# Patient Record
Sex: Female | Born: 1940 | Race: Black or African American | Hispanic: No | Marital: Married | Smoking: Never smoker
Health system: Southern US, Community
[De-identification: ages and names within clinical notes are randomized; demographics above are authoritative.]

## PROBLEM LIST (undated history)

## (undated) DIAGNOSIS — I10 Essential (primary) hypertension: Secondary | ICD-10-CM

## (undated) DIAGNOSIS — M1990 Unspecified osteoarthritis, unspecified site: Secondary | ICD-10-CM

## (undated) DIAGNOSIS — E119 Type 2 diabetes mellitus without complications: Secondary | ICD-10-CM

## (undated) DIAGNOSIS — R252 Cramp and spasm: Secondary | ICD-10-CM

## (undated) DIAGNOSIS — K219 Gastro-esophageal reflux disease without esophagitis: Secondary | ICD-10-CM

## (undated) DIAGNOSIS — G629 Polyneuropathy, unspecified: Secondary | ICD-10-CM

## (undated) DIAGNOSIS — M810 Age-related osteoporosis without current pathological fracture: Secondary | ICD-10-CM

## (undated) DIAGNOSIS — N289 Disorder of kidney and ureter, unspecified: Secondary | ICD-10-CM

## (undated) DIAGNOSIS — E7800 Pure hypercholesterolemia, unspecified: Secondary | ICD-10-CM

## (undated) HISTORY — DX: Disorder of kidney and ureter, unspecified: N28.9

## (undated) HISTORY — DX: Polyneuropathy, unspecified: G62.9

## (undated) HISTORY — DX: Cramp and spasm: R25.2

## (undated) HISTORY — DX: Unspecified osteoarthritis, unspecified site: M19.90

## (undated) HISTORY — DX: Pure hypercholesterolemia, unspecified: E78.00

## (undated) HISTORY — DX: Essential (primary) hypertension: I10

## (undated) HISTORY — PX: OTHER SURGICAL HISTORY: SHX169

## (undated) HISTORY — DX: Age-related osteoporosis without current pathological fracture: M81.0

## (undated) HISTORY — DX: Type 2 diabetes mellitus without complications: E11.9

## (undated) HISTORY — DX: Gastro-esophageal reflux disease without esophagitis: K21.9

---

## 2004-06-05 ENCOUNTER — Ambulatory Visit: Payer: Self-pay | Admitting: Internal Medicine

## 2005-04-10 ENCOUNTER — Ambulatory Visit: Payer: Self-pay | Admitting: Gastroenterology

## 2005-06-07 ENCOUNTER — Ambulatory Visit: Payer: Self-pay | Admitting: Internal Medicine

## 2005-09-12 ENCOUNTER — Ambulatory Visit: Payer: Self-pay | Admitting: Internal Medicine

## 2006-02-08 ENCOUNTER — Ambulatory Visit: Payer: Self-pay | Admitting: Internal Medicine

## 2006-04-30 ENCOUNTER — Ambulatory Visit: Payer: Self-pay | Admitting: Internal Medicine

## 2006-06-12 ENCOUNTER — Ambulatory Visit: Payer: Self-pay | Admitting: Internal Medicine

## 2007-10-08 ENCOUNTER — Ambulatory Visit: Payer: Self-pay | Admitting: Internal Medicine

## 2007-11-28 ENCOUNTER — Ambulatory Visit: Payer: Self-pay | Admitting: Internal Medicine

## 2008-10-19 ENCOUNTER — Ambulatory Visit: Payer: Self-pay | Admitting: Internal Medicine

## 2008-11-03 ENCOUNTER — Ambulatory Visit: Payer: Self-pay | Admitting: Nephrology

## 2008-11-19 ENCOUNTER — Ambulatory Visit: Payer: Self-pay | Admitting: Unknown Physician Specialty

## 2009-01-10 ENCOUNTER — Ambulatory Visit: Payer: Self-pay | Admitting: Internal Medicine

## 2009-01-10 ENCOUNTER — Encounter: Payer: Self-pay | Admitting: Gastroenterology

## 2009-01-25 ENCOUNTER — Encounter: Payer: Self-pay | Admitting: Gastroenterology

## 2009-01-25 ENCOUNTER — Ambulatory Visit: Payer: Self-pay | Admitting: Internal Medicine

## 2009-02-15 ENCOUNTER — Encounter: Payer: Self-pay | Admitting: Gastroenterology

## 2009-03-09 ENCOUNTER — Encounter: Payer: Self-pay | Admitting: Gastroenterology

## 2009-03-09 ENCOUNTER — Telehealth (INDEPENDENT_AMBULATORY_CARE_PROVIDER_SITE_OTHER): Payer: Self-pay | Admitting: *Deleted

## 2009-03-09 DIAGNOSIS — R933 Abnormal findings on diagnostic imaging of other parts of digestive tract: Secondary | ICD-10-CM

## 2009-04-07 ENCOUNTER — Encounter: Payer: Self-pay | Admitting: Gastroenterology

## 2009-04-07 ENCOUNTER — Ambulatory Visit: Payer: Self-pay | Admitting: Gastroenterology

## 2009-04-07 ENCOUNTER — Ambulatory Visit (HOSPITAL_COMMUNITY): Admission: RE | Admit: 2009-04-07 | Discharge: 2009-04-07 | Payer: Self-pay | Admitting: Gastroenterology

## 2010-02-20 ENCOUNTER — Ambulatory Visit: Payer: Self-pay | Admitting: Internal Medicine

## 2010-09-12 ENCOUNTER — Ambulatory Visit: Payer: Self-pay | Admitting: Internal Medicine

## 2010-09-14 ENCOUNTER — Ambulatory Visit: Payer: Self-pay | Admitting: Internal Medicine

## 2010-10-09 ENCOUNTER — Encounter: Payer: Self-pay | Admitting: Neurology

## 2010-11-05 ENCOUNTER — Encounter: Payer: Self-pay | Admitting: Neurology

## 2010-11-10 LAB — GLUCOSE, CAPILLARY: Glucose-Capillary: 162 mg/dL — ABNORMAL HIGH (ref 70–99)

## 2011-03-21 ENCOUNTER — Ambulatory Visit: Payer: Self-pay | Admitting: Internal Medicine

## 2012-01-06 ENCOUNTER — Emergency Department: Payer: Self-pay | Admitting: Unknown Physician Specialty

## 2012-01-07 LAB — COMPREHENSIVE METABOLIC PANEL
Calcium, Total: 9.7 mg/dL (ref 8.5–10.1)
Chloride: 103 mmol/L (ref 98–107)
Co2: 26 mmol/L (ref 21–32)
Glucose: 208 mg/dL — ABNORMAL HIGH (ref 65–99)
Osmolality: 286 (ref 275–301)
SGOT(AST): 28 U/L (ref 15–37)
SGPT (ALT): 50 U/L
Total Protein: 8.1 g/dL (ref 6.4–8.2)

## 2012-01-07 LAB — CBC
HCT: 41.1 % (ref 35.0–47.0)
HGB: 13.6 g/dL (ref 12.0–16.0)
MCV: 93 fL (ref 80–100)
RBC: 4.43 10*6/uL (ref 3.80–5.20)
RDW: 14.8 % — ABNORMAL HIGH (ref 11.5–14.5)

## 2012-03-24 ENCOUNTER — Ambulatory Visit: Payer: Self-pay | Admitting: Internal Medicine

## 2012-05-23 ENCOUNTER — Telehealth: Payer: Self-pay | Admitting: Internal Medicine

## 2012-05-23 NOTE — Telephone Encounter (Signed)
She needs evaluated if concerned about a foot fracture.  Then can discuss bone density, etc.  Would rec eval.  If unable to be seen today or this weekend - can put her in on my schedule 05/27/12 at 11:30.  Thanks.

## 2012-05-23 NOTE — Telephone Encounter (Signed)
Patient advised via telephone, no acute symptoms, advised patient to watch for redness, increased swelling, warm to touch.  Advised to go to Urgent Care or ED over the weekend if symptoms worsen.   Appt scheduled for 05/27/2012.

## 2012-05-23 NOTE — Telephone Encounter (Signed)
Patient states last December had a fracture in right foot, now she feels as if she has one in her left foot. I offered her an appointment today however she couldn't come in.  Patient would like to know if she is getting enough calcium and do we need to do another bone density test.

## 2012-05-27 ENCOUNTER — Encounter: Payer: Self-pay | Admitting: Internal Medicine

## 2012-05-27 ENCOUNTER — Ambulatory Visit (INDEPENDENT_AMBULATORY_CARE_PROVIDER_SITE_OTHER): Payer: Medicare Other | Admitting: Internal Medicine

## 2012-05-27 VITALS — BP 144/77 | HR 79 | Temp 98.1°F | Ht 60.0 in | Wt 185.0 lb

## 2012-05-27 DIAGNOSIS — M810 Age-related osteoporosis without current pathological fracture: Secondary | ICD-10-CM

## 2012-05-27 DIAGNOSIS — I10 Essential (primary) hypertension: Secondary | ICD-10-CM

## 2012-05-27 DIAGNOSIS — G4730 Sleep apnea, unspecified: Secondary | ICD-10-CM

## 2012-05-27 DIAGNOSIS — E7800 Pure hypercholesterolemia, unspecified: Secondary | ICD-10-CM | POA: Insufficient documentation

## 2012-05-27 DIAGNOSIS — E119 Type 2 diabetes mellitus without complications: Secondary | ICD-10-CM | POA: Insufficient documentation

## 2012-05-27 DIAGNOSIS — N289 Disorder of kidney and ureter, unspecified: Secondary | ICD-10-CM

## 2012-05-27 DIAGNOSIS — Z23 Encounter for immunization: Secondary | ICD-10-CM

## 2012-05-27 DIAGNOSIS — R5383 Other fatigue: Secondary | ICD-10-CM

## 2012-05-27 DIAGNOSIS — R5381 Other malaise: Secondary | ICD-10-CM

## 2012-05-27 LAB — HEPATIC FUNCTION PANEL
ALT: 33 U/L (ref 0–35)
Albumin: 3.9 g/dL (ref 3.5–5.2)
Bilirubin, Direct: 0 mg/dL (ref 0.0–0.3)
Total Protein: 7.7 g/dL (ref 6.0–8.3)

## 2012-05-27 LAB — CBC WITH DIFFERENTIAL/PLATELET
Basophils Relative: 0.7 % (ref 0.0–3.0)
Eosinophils Absolute: 0.2 10*3/uL (ref 0.0–0.7)
HCT: 42.7 % (ref 36.0–46.0)
Lymphs Abs: 1.5 10*3/uL (ref 0.7–4.0)
MCHC: 32.4 g/dL (ref 30.0–36.0)
MCV: 93.2 fl (ref 78.0–100.0)
Monocytes Absolute: 0.5 10*3/uL (ref 0.1–1.0)
Neutrophils Relative %: 68.5 % (ref 43.0–77.0)
RBC: 4.58 Mil/uL (ref 3.87–5.11)

## 2012-05-27 NOTE — Patient Instructions (Addendum)
It was nice seeing you today.  I am going to get your bone density scheduled.  You will be called with an appt.  We will also notify you of your lab results - once they become available.

## 2012-05-27 NOTE — Progress Notes (Signed)
  Subjective:    Patient ID: Zoe Stewart, female    DOB: 1941/03/22, 71 y.o.   MRN: [id number]  HPI 71 year old female with past history of hypertension, renal insufficiency, diabetes and hypercholesterolemia who comes in today with concerns regarding a recent foot fracture.  States she was grocery shopping last week and heard a "pop".  Increased pain in her foot.  Saw Dr Alberteen Spindle.  Had a fracture of her second toe.  No known injury or trauma.  Other than wearing shoes that were too big, she did not notice or do anything out of the ordinary.  She had another foot fracture 12/12.    She otherwise is doing relatively well.  Had questions about her CPAP supplies.  Also, informed me she was still having intermittent muscle cramps. States she is eating and drinking well.  Saw Dr Cherylann Ratel two weeks ago.  States everything appeared to be stable.  Still seeing Dr Hyacinth Meeker.  AM sugars averaging 130s.  Some occasional lows.  Does not eat a bedtime snack.    Past Medical History  Diagnosis Date  . Arthritis   . Diabetes mellitus without complication   . Hypertension   . Renal insufficiency   . Hypercholesterolemia      Review of Systems Patient denies any headache, lightheadedness or dizziness.  No sinus or allergy symptoms.  No chest pain, tightness or palpatations.  No increased shortness of breath, cough or congestion.  No acid reflux, dysphagia or odynophagia.  No nausea or vomiting.  No abdominal pain or cramping.  No bowel change, such as diarrhea, constipation, BRBPR or melana.  No urine change.        Objective:   Physical Exam Filed Vitals:   05/27/12 1128  BP: 144/77  Pulse: 79  Temp: 98.1 F (36.7 C)   Blood pressure recheck:  31/68  71 year old female in no acute distress.   HEENT:  Nares - clear.  OP- without lesions or erythema.  NECK:  Supple, nontender.  No audible carotid bruit.   HEART:  Appears to be regular. LUNGS:  Without crackles or wheezing audible.  Respirations even and  unlabored.   RADIAL PULSE:  Equal bilaterally.  ABDOMEN:  Soft, nontender.  No audible abdominal bruit.   EXTREMITIES:  Some increased soft tissue swelling left foot.  Pain over the second metatarsal and second toe.  No increased erythema.  No swelling extending up the leg.  DP pulses palpable and equal bilaterally.  (2 plus).                      Assessment & Plan:  FOOT FRACTURE.  Second fracture in one year.  No know trauma or injury.  Schedule a bone density.  Unable to use bisphonates given her renal insufficiency.  Check metabolic labs including thyroid function and calcium, to confirm no metabolic etiology.  Will obtain records from Dr Cherylann Ratel to review.  (question if he has drawn intact PTH).  Continues to follow up with Dr Alberteen Spindle regarding her foot.   MEDICATIONS.  Pt did not bring her meds with her to her appt.  She is unsure of the doses.  Will call back with all meds and dosing of her meds so that we can update our list.   HEALTH MAINTENANCE.  Schedule for a physical next visit.  Discuss health maintenance issues with her at that time.

## 2012-05-28 LAB — BASIC METABOLIC PANEL WITH GFR
BUN: 23 mg/dL (ref 6–23)
Calcium: 9.7 mg/dL (ref 8.4–10.5)
Creat: 1.29 mg/dL — ABNORMAL HIGH (ref 0.50–1.10)
GFR, Est African American: 48 mL/min — ABNORMAL LOW
GFR, Est Non African American: 41 mL/min — ABNORMAL LOW
Glucose, Bld: 161 mg/dL — ABNORMAL HIGH (ref 70–99)
Potassium: 3.9 mEq/L (ref 3.5–5.3)

## 2012-05-28 LAB — VITAMIN D 25 HYDROXY (VIT D DEFICIENCY, FRACTURES): Vit D, 25-Hydroxy: 33 ng/mL (ref 30–89)

## 2012-05-29 LAB — TSH: TSH: 0.97 u[IU]/mL (ref 0.35–5.50)

## 2012-06-02 ENCOUNTER — Encounter: Payer: Self-pay | Admitting: *Deleted

## 2012-06-04 ENCOUNTER — Telehealth: Payer: Self-pay | Admitting: Internal Medicine

## 2012-06-04 NOTE — Telephone Encounter (Signed)
Pt called checking on her lab results.

## 2012-06-04 NOTE — Telephone Encounter (Signed)
Notify pt renal function stable.  Vit D and other labs ok.

## 2012-06-04 NOTE — Telephone Encounter (Signed)
Dr. Lorin Picket,  [name] would like a call about her test results. I was going to call until I saw that they were abnormal.    Thanks....Marland KitchenMarland KitchenAsher Muir

## 2012-06-08 ENCOUNTER — Encounter: Payer: Self-pay | Admitting: Internal Medicine

## 2012-06-08 DIAGNOSIS — N289 Disorder of kidney and ureter, unspecified: Secondary | ICD-10-CM | POA: Insufficient documentation

## 2012-06-08 NOTE — Assessment & Plan Note (Addendum)
Is on Lisinopril and Amlodipine.  Does not know doses of her meds and did not bring her meds or a list of medication.  Will call back with doses of her medications.  Blood pressure under good control.  Will continue same meds.  Check metabolic panel.

## 2012-06-08 NOTE — Assessment & Plan Note (Signed)
Second fracture in one year.  Unable to take bisphonates secondary to her renal insufficiency.  Schedule a bone density.  Check vit D level and thyroid function.

## 2012-06-08 NOTE — Assessment & Plan Note (Signed)
Seeing Dr Lateef.  Renal function has been stable.  Obtain last office note and labs.  Avoid antiinflammatories and other nephrotoxic meds.     

## 2012-06-08 NOTE — Assessment & Plan Note (Signed)
Following with Dr Hyacinth Meeker.  Sugars as outlined.  Same meds.  Diet and exercise.  Keep up to date with eye exams.

## 2012-06-08 NOTE — Assessment & Plan Note (Signed)
Low cholesterol diet and exercise.  On Lipitor.  Does not know dose and did not bring her meds.  Will call back with dose of medications.

## 2012-06-08 NOTE — Assessment & Plan Note (Signed)
Will discuss with her supply company.  Will have them send me order for what is needed.

## 2012-06-18 ENCOUNTER — Telehealth: Payer: Self-pay | Admitting: Internal Medicine

## 2012-06-18 NOTE — Telephone Encounter (Signed)
Pt is calling back concerning Bone Density test and getting it scheduled.

## 2012-07-10 ENCOUNTER — Other Ambulatory Visit: Payer: Self-pay | Admitting: Internal Medicine

## 2012-07-10 NOTE — Telephone Encounter (Signed)
Pt is needing refill on Generic For Lipitor 20 mg and she uses [facility] on [address].

## 2012-07-11 ENCOUNTER — Telehealth: Payer: Self-pay | Admitting: Internal Medicine

## 2012-07-11 NOTE — Telephone Encounter (Signed)
Is she using CPAP now.  Does she have a machine at home.

## 2012-07-11 NOTE — Telephone Encounter (Signed)
Pt dropped off results from her sleep study and she was wondering when she will be set up to get more supplies for her CPAP ??? Please give her a call.

## 2012-07-11 NOTE — Telephone Encounter (Signed)
Patient does have machine at home that she uses. [facility] is not giving her any more supplies. Patient does not know why she can know longer receive them. She wants to know what needs to be done. She stated that all of her supplies and some equipment needs to be replaced.

## 2012-07-12 NOTE — Telephone Encounter (Signed)
Notify pt I have called [facility] and left a message for them to contact me and let me know what the problems is - regarding her not getting her supplies.  Tell her to let me know if she hears anything.

## 2012-07-15 NOTE — Telephone Encounter (Signed)
Called patient to let her know. She said that she has not heard anything yet.

## 2012-07-15 NOTE — Telephone Encounter (Signed)
Called and left a messge for [facility] again.   They are supposed to call the office.

## 2012-07-17 ENCOUNTER — Telehealth: Payer: Self-pay | Admitting: Internal Medicine

## 2012-07-17 NOTE — Telephone Encounter (Signed)
Refill request for atorvastatin 20 mg tablet Qty: 30 Sig: take 1 tablet by mouth once daily Patient has been seen

## 2012-07-21 NOTE — Telephone Encounter (Signed)
Is this something that you prescribed?

## 2012-07-21 NOTE — Telephone Encounter (Signed)
I know that she is supposed to be on cholesterol medication.  She did not have her meds with her when we saw her here.  She was supposed to call back or drop off a list so that we could update her meds in our system.  Confirm with pt taking this and - ok to refill x 2.  Need her note from [facility].

## 2012-07-22 ENCOUNTER — Other Ambulatory Visit: Payer: Self-pay | Admitting: *Deleted

## 2012-07-22 MED ORDER — ATORVASTATIN CALCIUM 10 MG PO TABS: 10.0000 mg | ORAL_TABLET | Freq: Every day | ORAL | Status: DC

## 2012-07-22 NOTE — Telephone Encounter (Signed)
Sent in to pharmacy.  

## 2012-08-04 ENCOUNTER — Encounter: Payer: Self-pay | Admitting: Internal Medicine

## 2012-08-10 ENCOUNTER — Telehealth: Payer: Self-pay | Admitting: Internal Medicine

## 2012-08-10 NOTE — Telephone Encounter (Signed)
Notify pt that I reviewed her bone density - revealed no significant change from last (osteopenia - no osteoporosis).  Continue her same medication as she is doing.

## 2012-08-11 NOTE — Telephone Encounter (Signed)
Patient notified

## 2012-09-02 ENCOUNTER — Telehealth: Payer: Self-pay | Admitting: Internal Medicine

## 2012-09-02 NOTE — Telephone Encounter (Signed)
Called to cancel appointment for 09/03/12 at 0900 with Dr Lorin Picket.  Reports she was unable to reach the scheduling line, already has another appointment for 09/03/12, preferred to cancel due to weather, and will call back to reschedule when knows when she will have transportation.

## 2012-09-03 ENCOUNTER — Ambulatory Visit: Payer: Self-pay | Admitting: Internal Medicine

## 2012-11-13 NOTE — Telephone Encounter (Signed)
Please close encounter

## 2012-11-24 ENCOUNTER — Telehealth: Payer: Self-pay | Admitting: Internal Medicine

## 2012-11-24 MED ORDER — LISINOPRIL 40 MG PO TABS: 40.0000 mg | ORAL_TABLET | Freq: Every day | ORAL | Status: DC

## 2012-11-24 NOTE — Telephone Encounter (Signed)
Lisinopril refill needed.  [facility].

## 2012-11-24 NOTE — Telephone Encounter (Signed)
Sent lisinopril [facility]ills [facility]

## 2012-12-12 ENCOUNTER — Ambulatory Visit (INDEPENDENT_AMBULATORY_CARE_PROVIDER_SITE_OTHER): Payer: Medicare Other | Admitting: Internal Medicine

## 2012-12-12 ENCOUNTER — Encounter: Payer: Self-pay | Admitting: Internal Medicine

## 2012-12-12 VITALS — BP 120/60 | HR 77 | Temp 98.4°F | Ht 60.0 in | Wt 171.2 lb

## 2012-12-12 DIAGNOSIS — M810 Age-related osteoporosis without current pathological fracture: Secondary | ICD-10-CM

## 2012-12-12 DIAGNOSIS — I10 Essential (primary) hypertension: Secondary | ICD-10-CM

## 2012-12-12 DIAGNOSIS — R0989 Other specified symptoms and signs involving the circulatory and respiratory systems: Secondary | ICD-10-CM

## 2012-12-12 DIAGNOSIS — N289 Disorder of kidney and ureter, unspecified: Secondary | ICD-10-CM

## 2012-12-12 DIAGNOSIS — R42 Dizziness and giddiness: Secondary | ICD-10-CM

## 2012-12-12 DIAGNOSIS — E7800 Pure hypercholesterolemia, unspecified: Secondary | ICD-10-CM

## 2012-12-12 DIAGNOSIS — G4730 Sleep apnea, unspecified: Secondary | ICD-10-CM

## 2012-12-12 DIAGNOSIS — E119 Type 2 diabetes mellitus without complications: Secondary | ICD-10-CM

## 2012-12-14 ENCOUNTER — Encounter: Payer: Self-pay | Admitting: Internal Medicine

## 2012-12-14 NOTE — Assessment & Plan Note (Signed)
Unable to take bisphonates secondary to her renal insufficiency.  Had bone density 12/13 - osteopenia.  No significant change.

## 2012-12-14 NOTE — Assessment & Plan Note (Signed)
Seeing Dr Cherylann Ratel.  Renal function has been stable.  Obtain last office note and labs.  Avoid antiinflammatories and other nephrotoxic meds.

## 2012-12-14 NOTE — Progress Notes (Signed)
Subjective:    Patient ID: Zoe Stewart, female    DOB: April 11, 1941, 72 y.o.   MRN: [id number]  HPI 72 year old female with past history of hypertension, renal insufficiency, diabetes and hypercholesterolemia who comes in today for a scheduled follow up.  Under increased stress.  Her husband recently passed away.  She is trying to get her house ready to sell.  Thinks she is going to move close to her family in [place].  Feels she is handling things relatively well now.  Sleeping better.  Eating better.  Appetite has improved.  Has lost weight.  Sugars mostly running 80-130 in the am and overall under good control in the pm.  Better since losing weight.  She is not using the humalog now.  Only on Lantus and Januvia.  Saw Dr Cherylann Ratel recently.  Kidney function stable.  Has had some issues with dizziness.  Has noticed some light headedness.  Did have an episode where she turned over in bed and room started spinning.  Also, has noticed a strange pressure sensation in her head.  Still notices some, but has improved.  No severe headache.  Drove herself here.  No problems driving.  Taking aspirin.  No chest pain or tightness.   Breathing stable.     Past Medical History  Diagnosis Date  . Osteoarthritis     trigger nodules, carpal tunnel, arthritis of the foot  . Diabetes mellitus   . Hypertension   . Renal insufficiency   . Hypercholesterolemia   . Peripheral neuropathy   . Osteoporosis   . GERD (gastroesophageal reflux disease)   . Muscle cramps     borderline low magnesium    Current Outpatient Prescriptions on File Prior to Visit  Medication Sig Dispense Refill  . allopurinol (ZYLOPRIM) 100 MG tablet Take 100 mg by mouth daily.      Marland Kitchen amitriptyline (ELAVIL) 10 MG tablet Take 10 mg by mouth at bedtime.      Marland Kitchen amLODipine (NORVASC) 10 MG tablet Take 10 mg by mouth daily.      Marland Kitchen atorvastatin (LIPITOR) 10 MG tablet Take 1 tablet (10 mg total) by mouth daily.  30 tablet  2  . Cholecalciferol  ([employer] VITAMIN D3) 1000 UNITS capsule Take 1,000 Units by mouth daily.      Marland Kitchen gabapentin (NEURONTIN) 100 MG capsule Take 100 mg by mouth at bedtime.      Marland Kitchen glucose blood test strip One touch ultra test strip Check blood sugar tid      . insulin glargine (LANTUS) 100 UNIT/ML injection Inject into the skin 2 (two) times daily. Takes 40 units q am and 38 units q hs      . insulin lispro (HUMALOG) 100 UNIT/ML injection Inject into the skin 3 (three) times daily before meals. 12 units q am, 10 units at lunch and 18 units before supper      . lisinopril (PRINIVIL,ZESTRIL) 40 MG tablet Take 1 tablet (40 mg total) by mouth daily.  30 tablet  5  . omeprazole (PRILOSEC) 40 MG capsule Take 40 mg by mouth 2 (two) times daily.      . sitaGLIPtin (JANUVIA) 100 MG tablet Take 100 mg by mouth daily.       No current facility-administered medications on file prior to visit.    Review of Systems Head pressure and lightheadedness as outlined.  No sinus or allergy symptoms.  No chest pain, tightness or palpitations.  No increased shortness of breath, cough  or congestion.  No acid reflux, dysphagia or odynophagia.  No nausea or vomiting currently.  Had some nausea this am.  States she is eating better.  Appetite improved.   No abdominal pain or cramping.  No bowel change, such as diarrhea, constipation, BRBPR or melana.  No urine change.        Objective:   Physical Exam  Filed Vitals:   12/12/12 1446  BP: 120/60  Pulse: 77  Temp: 98.4 F (36.9 C)   Pulse 61  72 year old female in no acute distress.   HEENT:  Nares - clear.  OP- without lesions or erythema.  TMs visualized without erythema.  NECK:  Supple, nontender.  No audible carotid bruit.   HEART:  Appears to be regular. LUNGS:  Without crackles or wheezing audible.  Respirations even and unlabored.   RADIAL PULSE:  Equal bilaterally.  ABDOMEN:  Soft, nontender.  No audible abdominal bruit.   EXTREMITIES:  No increased edema present.                     Assessment & Plan:  HEAD PRESSURE ASSOCIATED WITH DIZZINESS.  We discussed ER transfer today.  She feels things have improved and desires not to go to the ER today.  Unclear as to the exact etiology.  May have vertigo.  Has a history of this in the past.  Discussed Epley maneuvers and discussed referral back to ENT.  Given the head pressure, will obtain MRI without contrast.  Need to avoid the contrast given her renal insufficiency.  Will also check carotid ultrasound.  Further w/up pending.  Continue daily aspirin.  Pt was notified that if her symptoms changed or worsened, she needed immediate evaluation.    FOOT FRACTURE.  Doing well.  Saw Dr Alberteen Spindle.   HEALTH MAINTENANCE.  Schedule for a physical next visit.  Discuss health maintenance issues with her at that time.

## 2012-12-14 NOTE — Assessment & Plan Note (Signed)
Has been following with Dr Hyacinth Meeker.  Sugars as outlined.  Same meds.  Diet and exercise.  Keep up to date with eye exams.  Sugars better since the weight loss.  Not on humalog currently.  Follow.

## 2012-12-14 NOTE — Assessment & Plan Note (Signed)
Blood pressure under good control.  Will continue same meds.  Check metabolic panel.

## 2012-12-14 NOTE — Assessment & Plan Note (Signed)
Continue CPAP.  

## 2012-12-14 NOTE — Assessment & Plan Note (Signed)
Low cholesterol diet and exercise.  On Lipitor.  Check lipid panel and liver function.   

## 2012-12-15 ENCOUNTER — Encounter: Payer: Self-pay | Admitting: Emergency Medicine

## 2012-12-19 ENCOUNTER — Other Ambulatory Visit: Payer: Self-pay | Admitting: *Deleted

## 2012-12-19 ENCOUNTER — Telehealth: Payer: Self-pay | Admitting: Internal Medicine

## 2012-12-19 MED ORDER — ATORVASTATIN CALCIUM 10 MG PO TABS: 10.0000 mg | ORAL_TABLET | Freq: Every day | ORAL | Status: DC

## 2012-12-19 NOTE — Telephone Encounter (Signed)
Refilled x 1 until next appt in June

## 2012-12-19 NOTE — Telephone Encounter (Signed)
atorvastatin (LIPITOR) 10 MG tablet #30

## 2012-12-24 ENCOUNTER — Encounter: Payer: Self-pay | Admitting: *Deleted

## 2012-12-24 ENCOUNTER — Ambulatory Visit: Payer: Self-pay | Admitting: Internal Medicine

## 2012-12-26 ENCOUNTER — Telehealth: Payer: Self-pay | Admitting: *Deleted

## 2012-12-26 NOTE — Telephone Encounter (Signed)
Pt informed that her MRI Brain (5/21) & Carotid U/S (5/15)- were both normal. Pt requested that I send a copy of both to [facility] (Dr. Cherylann Ratel)

## 2012-12-30 ENCOUNTER — Other Ambulatory Visit (INDEPENDENT_AMBULATORY_CARE_PROVIDER_SITE_OTHER): Payer: Medicare Other

## 2012-12-30 DIAGNOSIS — R42 Dizziness and giddiness: Secondary | ICD-10-CM

## 2012-12-30 DIAGNOSIS — E7800 Pure hypercholesterolemia, unspecified: Secondary | ICD-10-CM

## 2012-12-30 DIAGNOSIS — N289 Disorder of kidney and ureter, unspecified: Secondary | ICD-10-CM

## 2012-12-30 DIAGNOSIS — E119 Type 2 diabetes mellitus without complications: Secondary | ICD-10-CM

## 2012-12-30 LAB — BASIC METABOLIC PANEL
BUN: 16 mg/dL (ref 6–23)
CO2: 28 mEq/L (ref 19–32)
Calcium: 9.4 mg/dL (ref 8.4–10.5)
Chloride: 102 mEq/L (ref 96–112)
Creatinine, Ser: 1 mg/dL (ref 0.4–1.2)
Glucose, Bld: 118 mg/dL — ABNORMAL HIGH (ref 70–99)

## 2012-12-30 LAB — CBC WITH DIFFERENTIAL/PLATELET
Basophils Absolute: 0.1 10*3/uL (ref 0.0–0.1)
Basophils Relative: 0.6 % (ref 0.0–3.0)
Eosinophils Absolute: 0.1 10*3/uL (ref 0.0–0.7)
MCHC: 33.8 g/dL (ref 30.0–36.0)
MCV: 89.3 fl (ref 78.0–100.0)
Monocytes Absolute: 0.5 10*3/uL (ref 0.1–1.0)
Neutrophils Relative %: 67.3 % (ref 43.0–77.0)
RBC: 4.68 Mil/uL (ref 3.87–5.11)
RDW: 14.9 % — ABNORMAL HIGH (ref 11.5–14.6)

## 2012-12-30 LAB — HEPATIC FUNCTION PANEL
Bilirubin, Direct: 0.1 mg/dL (ref 0.0–0.3)
Total Protein: 6.9 g/dL (ref 6.0–8.3)

## 2012-12-30 LAB — LIPID PANEL
HDL: 75.8 mg/dL (ref >39.00)
VLDL: 17.2 mg/dL (ref 0.0–40.0)

## 2012-12-30 LAB — LDL CHOLESTEROL, DIRECT: Direct LDL: 118 mg/dL

## 2012-12-30 LAB — TSH: TSH: 1.44 u[IU]/mL (ref 0.35–5.50)

## 2012-12-31 ENCOUNTER — Encounter: Payer: Self-pay | Admitting: Internal Medicine

## 2013-01-01 ENCOUNTER — Other Ambulatory Visit: Payer: Medicare Other

## 2013-01-02 ENCOUNTER — Telehealth: Payer: Self-pay

## 2013-01-02 NOTE — Telephone Encounter (Signed)
My Chart Message: Your kidney function has improved. Your complete blood count and liver function are within normal limits. Your overall sugar control (a1c) is elevated (8.1). Check and record your sugars at least twice a day and bring with you to your upcoming appointment. Your cholesterol is ok, but the LDL (bad cholesterol) is a little elevated. Low cholesterol diet and exercise    Notified patient of recent labs ^^^^^^^. Patient stated that her sugar is high when she forgets to give herself a injection. She also stated that she has been watching what she is eating . And that Dr. Hyacinth Meeker checked her a1c 3 months ago and her level was 7.1. Patient does have appointment on Tuesday

## 2013-01-06 ENCOUNTER — Ambulatory Visit (INDEPENDENT_AMBULATORY_CARE_PROVIDER_SITE_OTHER): Payer: Medicare Other | Admitting: Internal Medicine

## 2013-01-06 ENCOUNTER — Encounter: Payer: Self-pay | Admitting: Internal Medicine

## 2013-01-06 VITALS — BP 110/60 | HR 72 | Temp 97.9°F | Ht 60.0 in | Wt 171.5 lb

## 2013-01-06 DIAGNOSIS — N289 Disorder of kidney and ureter, unspecified: Secondary | ICD-10-CM

## 2013-01-06 DIAGNOSIS — I10 Essential (primary) hypertension: Secondary | ICD-10-CM

## 2013-01-06 DIAGNOSIS — G4730 Sleep apnea, unspecified: Secondary | ICD-10-CM

## 2013-01-06 DIAGNOSIS — E119 Type 2 diabetes mellitus without complications: Secondary | ICD-10-CM

## 2013-01-06 DIAGNOSIS — E7800 Pure hypercholesterolemia, unspecified: Secondary | ICD-10-CM

## 2013-01-06 DIAGNOSIS — M810 Age-related osteoporosis without current pathological fracture: Secondary | ICD-10-CM

## 2013-01-07 ENCOUNTER — Encounter: Payer: Self-pay | Admitting: Internal Medicine

## 2013-01-10 ENCOUNTER — Encounter: Payer: Self-pay | Admitting: Internal Medicine

## 2013-01-10 NOTE — Progress Notes (Signed)
Subjective:    Patient ID: Zoe Stewart, female    DOB: Jan 11, 1941, 72 y.o.   MRN: [id number]  HPI 72 year old female with past history of hypertension, renal insufficiency, diabetes and hypercholesterolemia who comes in today for a scheduled follow up.  Under increased stress.  Her husband recently passed away.  She is trying to get her house ready to sell.  Planning to move close to her family in [place].  Feels she is handling things relatively well now.  Sleeping better.  Eating better.  Appetite has improved.  Sugars increased recently.  Varying.  Some AM sugars 90-120s and some 180-200s.  Evening sugars varying as well.  (180-200s).   She is not using the humalog now.  Only on Lantus and Januvia.  She also forgets the lantus at times.  Appears to be occurring more frequently in  May.  Saw Dr Cherylann Ratel recently.  Kidney function stable.  No chest pain or tightness.   Breathing stable.  No signficant dizziness or light headedness.  She is seeing a Veterinary surgeon through [facility].  Helping.      Past Medical History  Diagnosis Date  . Osteoarthritis     trigger nodules, carpal tunnel, arthritis of the foot  . Diabetes mellitus   . Hypertension   . Renal insufficiency   . Hypercholesterolemia   . Peripheral neuropathy   . Osteoporosis   . GERD (gastroesophageal reflux disease)   . Muscle cramps     borderline low magnesium    Current Outpatient Prescriptions on File Prior to Visit  Medication Sig Dispense Refill  . allopurinol (ZYLOPRIM) 100 MG tablet Take 100 mg by mouth daily.      Marland Kitchen amitriptyline (ELAVIL) 10 MG tablet Take 10 mg by mouth at bedtime.      Marland Kitchen amLODipine (NORVASC) 10 MG tablet Take 10 mg by mouth daily.      Marland Kitchen atorvastatin (LIPITOR) 10 MG tablet Take 1 tablet (10 mg total) by mouth daily.  30 tablet  0  . Cholecalciferol ([employer]S VITAMIN D3) 1000 UNITS capsule Take 1,000 Units by mouth daily.      Marland Kitchen gabapentin (NEURONTIN) 100 MG capsule Take 100 mg by mouth at bedtime.       Marland Kitchen glucose blood test strip One touch ultra test strip Check blood sugar tid      . insulin glargine (LANTUS) 100 UNIT/ML injection Inject into the skin 2 (two) times daily. Takes 40 units q am and 38 units q hs      . insulin lispro (HUMALOG) 100 UNIT/ML injection Inject into the skin 3 (three) times daily before meals. 12 units q am, 10 units at lunch and 18 units before supper      . lisinopril (PRINIVIL,ZESTRIL) 40 MG tablet Take 1 tablet (40 mg total) by mouth daily.  30 tablet  5  . omeprazole (PRILOSEC) 40 MG capsule Take 40 mg by mouth 2 (two) times daily.      . sitaGLIPtin (JANUVIA) 100 MG tablet Take 100 mg by mouth daily.       No current facility-administered medications on file prior to visit.    Review of Systems no significant light headedness or dizziness.   No sinus or allergy symptoms.  No chest pain, tightness or palpitations.  No increased shortness of breath, cough or congestion.  No acid reflux, dysphagia or odynophagia.  No nausea or vomiting currently.  States she is eating better.  Appetite improved.   No abdominal pain  or cramping.  No bowel change, such as diarrhea, constipation, BRBPR or melana.  No urine change.   Sugars as outlined.  Still with increased stress.  Dealing with her husbands death.  Seeing a Veterinary surgeon at [facility].       Objective:   Physical Exam  Filed Vitals:   01/06/13 1436  BP: 110/60  Pulse: 72  Temp: 97.9 F (58.15 C)   72 year old female in no acute distress.   HEENT:  Nares - clear.  OP- without lesions or erythema.   NECK:  Supple, nontender.  No audible carotid bruit.   HEART:  Appears to be regular. LUNGS:  Without crackles or wheezing audible.  Respirations even and unlabored.   RADIAL PULSE:  Equal bilaterally.  ABDOMEN:  Soft, nontender.  No audible abdominal bruit.   EXTREMITIES:  No increased edema present.                    Assessment & Plan:  HEAD PRESSURE ASSOCIATED WITH DIZZINESS.  Resolved.  Not a significant issue  for her now.  Follow.    FOOT FRACTURE.  Doing well.  Saw Dr Alberteen Spindle.   HEALTH MAINTENANCE.  Schedule for a physical next visit.  Discuss health maintenance issues with her at that time.

## 2013-01-10 NOTE — Assessment & Plan Note (Signed)
Blood pressure under good control.  Will continue same meds.  Follow metabolic panel.

## 2013-01-10 NOTE — Assessment & Plan Note (Signed)
Seeing Dr Cherylann Ratel.  Renal function has been stable.  Avoid antiinflammatories and other nephrotoxic meds.

## 2013-01-10 NOTE — Assessment & Plan Note (Signed)
Continue CPAP.  

## 2013-01-10 NOTE — Assessment & Plan Note (Signed)
Low cholesterol diet and exercise.  On Lipitor.  Follow lipid panel and liver function.  Lipid profile 12/30/12 revealed total cholesterol 215, triglycerides 86, HDL 76 and LDL 118.

## 2013-01-10 NOTE — Assessment & Plan Note (Signed)
Unable to take bisphonates secondary to her renal insufficiency.  Had bone density 12/13 - osteopenia.  No significant change.           

## 2013-01-10 NOTE — Assessment & Plan Note (Addendum)
Has been following with Dr Hyacinth Meeker.  Sugars as outlined.  Increased.  A1c - 8.1.  She has missed doses of Lantus.  Not using the humalog.   Diet and exercise.  Keep up to date with eye exams.  Have her make sure she is taking her Lantus regularly.  Check and record sugars and get her back in soon to reassess.  May need to add back Humalog.

## 2013-01-26 ENCOUNTER — Other Ambulatory Visit: Payer: Self-pay | Admitting: *Deleted

## 2013-01-26 MED ORDER — ATORVASTATIN CALCIUM 10 MG PO TABS: 10.0000 mg | ORAL_TABLET | Freq: Every day | ORAL | Status: DC

## 2013-01-31 ENCOUNTER — Encounter: Payer: Self-pay | Admitting: Internal Medicine

## 2013-02-05 ENCOUNTER — Encounter: Payer: Self-pay | Admitting: Internal Medicine

## 2013-02-05 ENCOUNTER — Ambulatory Visit (INDEPENDENT_AMBULATORY_CARE_PROVIDER_SITE_OTHER): Payer: Medicare Other | Admitting: Internal Medicine

## 2013-02-05 VITALS — BP 110/70 | HR 93 | Temp 98.3°F | Ht 60.0 in | Wt 171.0 lb

## 2013-02-05 DIAGNOSIS — E7800 Pure hypercholesterolemia, unspecified: Secondary | ICD-10-CM

## 2013-02-05 DIAGNOSIS — I10 Essential (primary) hypertension: Secondary | ICD-10-CM

## 2013-02-05 DIAGNOSIS — G4730 Sleep apnea, unspecified: Secondary | ICD-10-CM

## 2013-02-05 DIAGNOSIS — E119 Type 2 diabetes mellitus without complications: Secondary | ICD-10-CM

## 2013-02-05 DIAGNOSIS — M810 Age-related osteoporosis without current pathological fracture: Secondary | ICD-10-CM

## 2013-02-05 DIAGNOSIS — N289 Disorder of kidney and ureter, unspecified: Secondary | ICD-10-CM

## 2013-02-08 ENCOUNTER — Encounter: Payer: Self-pay | Admitting: Internal Medicine

## 2013-02-08 NOTE — Assessment & Plan Note (Signed)
Unable to take bisphonates secondary to her renal insufficiency.  Had bone density 12/13 - osteopenia.  No significant change.

## 2013-02-08 NOTE — Assessment & Plan Note (Signed)
Has been following with Dr Hyacinth Meeker.  Sugars as outlined.  Increased.  A1c - 8.1 on last check.  She had missed doses of Lantus.  Not using the humalog.   Diet and exercise.  Keep up to date with eye exams.  Sugars improved.  Only on Lantus now.  Has decreased the dose.  Follow met b and a1c.  Follow sugars closely.

## 2013-02-08 NOTE — Progress Notes (Signed)
Subjective:    Patient ID: Zoe Stewart, female    DOB: 1940/09/09, 72 y.o.   MRN: [id number]  HPI 72 year old female with past history of hypertension, renal insufficiency, diabetes and hypercholesterolemia who comes in today for a scheduled follow up.  Under increased stress.  Her husband recently passed away.  She is trying to get her house ready to sell.  Planning to move close to her family in [place].  Feels she is handling things relatively well now.  Sleeping better.  Eating better.  Appetite has improved.  Sugars increased recently.  She was not taking her medication as prescribed.  She is now watching more what she eats.  Staying active.  Now AM sugars averaging 90-130 and pm sugars averaging 110-160s.  Overall improved.   She is not using the humalog now.  Only on Lantus and Januvia.  She has cut down on her Lantus.  Now taking 20 units bid.  Saw Dr Cherylann Ratel recently.  Kidney function stable.  No chest pain or tightness.   Breathing stable.  No signficant dizziness or light headedness.  She is seeing a Veterinary surgeon through [facility].  Helping significantly.  Feels better.     Past Medical History  Diagnosis Date  . Osteoarthritis     trigger nodules, carpal tunnel, arthritis of the foot  . Diabetes mellitus   . Hypertension   . Renal insufficiency   . Hypercholesterolemia   . Peripheral neuropathy   . Osteoporosis   . GERD (gastroesophageal reflux disease)   . Muscle cramps     borderline low magnesium    Current Outpatient Prescriptions on File Prior to Visit  Medication Sig Dispense Refill  . allopurinol (ZYLOPRIM) 100 MG tablet Take 100 mg by mouth daily.      Marland Kitchen amitriptyline (ELAVIL) 10 MG tablet Take 10 mg by mouth at bedtime.      Marland Kitchen amLODipine (NORVASC) 10 MG tablet Take 10 mg by mouth daily.      Marland Kitchen atorvastatin (LIPITOR) 10 MG tablet Take 1 tablet (10 mg total) by mouth daily.  30 tablet  5  . Cholecalciferol ([employer] VITAMIN D3) 1000 UNITS capsule Take 1,000 Units by mouth  daily.      Marland Kitchen gabapentin (NEURONTIN) 100 MG capsule Take 100 mg by mouth at bedtime.      Marland Kitchen glucose blood test strip One touch ultra test strip Check blood sugar tid      . insulin glargine (LANTUS) 100 UNIT/ML injection Inject into the skin 2 (two) times daily. Takes 40 units q am and 38 units q hs      . insulin lispro (HUMALOG) 100 UNIT/ML injection Inject into the skin 3 (three) times daily before meals. 12 units q am, 10 units at lunch and 18 units before supper      . lisinopril (PRINIVIL,ZESTRIL) 40 MG tablet Take 1 tablet (40 mg total) by mouth daily.  30 tablet  5  . omeprazole (PRILOSEC) 40 MG capsule Take 40 mg by mouth 2 (two) times daily.      . sitaGLIPtin (JANUVIA) 100 MG tablet Take 100 mg by mouth daily.       No current facility-administered medications on file prior to visit.    Review of Systems No light headedness or dizziness.   Went to ENT.  Epley maneuvers helped.  No sinus or allergy symptoms.  No chest pain, tightness or palpitations.  No increased shortness of breath, cough or congestion.  No acid reflux,  dysphagia or odynophagia.  No nausea or vomiting. States sh e is eating better.  Appetite improved.   No abdominal pain or cramping.  No bowel change, such as diarrhea, constipation, BRBPR or melana.  No urine change.   Sugars as outlined.  Still with increased stress.  Dealing with her husbands death.  Seeing a Veterinary surgeon at [facility].  Helping significantly.  Doing better.       Objective:   Physical Exam  Filed Vitals:   02/05/13 1507  BP: 110/70  Pulse: 93  Temp: 98.3 F (38.46 C)   72 year old female in no acute distress.   HEENT:  Nares - clear.  OP- without lesions or erythema.   NECK:  Supple, nontender.  No audible carotid bruit.   HEART:  Appears to be regular. LUNGS:  Without crackles or wheezing audible.  Respirations even and unlabored.   RADIAL PULSE:  Equal bilaterally.  ABDOMEN:  Soft, nontender.  No audible abdominal bruit.   EXTREMITIES:  No  increased edema present.                    Assessment & Plan:  DIZZINESS.  Resolved.  Saw ENT.  Had Epley maneuvers.  Helped.    FOOT FRACTURE.  Doing well.  Saw Dr Alberteen Spindle.   HEALTH MAINTENANCE.  Schedule for a physical next visit.  Discuss health maintenance issues with her at that time.

## 2013-02-08 NOTE — Assessment & Plan Note (Signed)
Low cholesterol diet and exercise.  On Lipitor.  Follow lipid panel and liver function.  Lipid profile 12/30/12 revealed total cholesterol 215, triglycerides 86, HDL 76 and LDL 118.

## 2013-02-08 NOTE — Assessment & Plan Note (Signed)
Blood pressure under good control.  Will continue same meds.  Follow metabolic panel.

## 2013-02-08 NOTE — Assessment & Plan Note (Signed)
Continue CPAP.  

## 2013-02-08 NOTE — Assessment & Plan Note (Signed)
Seeing Dr Cherylann Ratel.  Renal function has been stable.  Avoid antiinflammatories and other nephrotoxic meds.

## 2013-02-18 ENCOUNTER — Encounter: Payer: Self-pay | Admitting: Internal Medicine

## 2013-03-11 ENCOUNTER — Encounter: Payer: Self-pay | Admitting: *Deleted

## 2013-03-11 ENCOUNTER — Other Ambulatory Visit: Payer: Self-pay | Admitting: *Deleted

## 2013-03-11 ENCOUNTER — Other Ambulatory Visit: Payer: Self-pay

## 2013-03-11 MED ORDER — LISINOPRIL 40 MG PO TABS: 40.0000 mg | ORAL_TABLET | Freq: Every day | ORAL | Status: DC

## 2013-04-10 ENCOUNTER — Telehealth: Payer: Self-pay | Admitting: *Deleted

## 2013-04-10 MED ORDER — INSULIN GLARGINE 100 UNIT/ML ~~LOC~~ SOLN: SUBCUTANEOUS | Status: DC

## 2013-04-10 NOTE — Telephone Encounter (Signed)
Refill Request  Lantus Solostar Pen 100U

## 2013-04-10 NOTE — Telephone Encounter (Signed)
Sent to [facility].

## 2013-05-07 ENCOUNTER — Encounter: Payer: Self-pay | Admitting: Internal Medicine

## 2013-05-11 ENCOUNTER — Other Ambulatory Visit (INDEPENDENT_AMBULATORY_CARE_PROVIDER_SITE_OTHER): Payer: Medicare Other

## 2013-05-11 DIAGNOSIS — I10 Essential (primary) hypertension: Secondary | ICD-10-CM

## 2013-05-11 DIAGNOSIS — E119 Type 2 diabetes mellitus without complications: Secondary | ICD-10-CM

## 2013-05-11 DIAGNOSIS — E7800 Pure hypercholesterolemia, unspecified: Secondary | ICD-10-CM

## 2013-05-11 DIAGNOSIS — N289 Disorder of kidney and ureter, unspecified: Secondary | ICD-10-CM

## 2013-05-11 LAB — BASIC METABOLIC PANEL
BUN: 17 mg/dL (ref 6–23)
Calcium: 9.8 mg/dL (ref 8.4–10.5)
GFR: 59.36 mL/min — ABNORMAL LOW (ref >60.00)
Glucose, Bld: 82 mg/dL (ref 70–99)
Potassium: 3.7 mEq/L (ref 3.5–5.1)

## 2013-05-11 LAB — LIPID PANEL: VLDL: 15 mg/dL (ref 0.0–40.0)

## 2013-05-11 LAB — HEPATIC FUNCTION PANEL
Alkaline Phosphatase: 44 U/L (ref 39–117)
Bilirubin, Direct: 0.1 mg/dL (ref 0.0–0.3)
Total Protein: 7.4 g/dL (ref 6.0–8.3)

## 2013-05-12 ENCOUNTER — Encounter: Payer: Self-pay | Admitting: Internal Medicine

## 2013-05-14 ENCOUNTER — Encounter: Payer: Self-pay | Admitting: Internal Medicine

## 2013-05-14 ENCOUNTER — Ambulatory Visit (INDEPENDENT_AMBULATORY_CARE_PROVIDER_SITE_OTHER): Payer: Medicare Other | Admitting: Internal Medicine

## 2013-05-14 VITALS — BP 120/80 | HR 70 | Temp 98.0°F | Ht 60.0 in | Wt 169.5 lb

## 2013-05-14 DIAGNOSIS — F439 Reaction to severe stress, unspecified: Secondary | ICD-10-CM

## 2013-05-14 DIAGNOSIS — E119 Type 2 diabetes mellitus without complications: Secondary | ICD-10-CM

## 2013-05-14 DIAGNOSIS — I10 Essential (primary) hypertension: Secondary | ICD-10-CM

## 2013-05-14 DIAGNOSIS — Z733 Stress, not elsewhere classified: Secondary | ICD-10-CM

## 2013-05-14 DIAGNOSIS — Z23 Encounter for immunization: Secondary | ICD-10-CM

## 2013-05-14 DIAGNOSIS — Z1211 Encounter for screening for malignant neoplasm of colon: Secondary | ICD-10-CM

## 2013-05-14 DIAGNOSIS — N289 Disorder of kidney and ureter, unspecified: Secondary | ICD-10-CM

## 2013-05-14 DIAGNOSIS — E7800 Pure hypercholesterolemia, unspecified: Secondary | ICD-10-CM

## 2013-05-14 DIAGNOSIS — G4730 Sleep apnea, unspecified: Secondary | ICD-10-CM

## 2013-05-14 DIAGNOSIS — M810 Age-related osteoporosis without current pathological fracture: Secondary | ICD-10-CM

## 2013-05-14 MED ORDER — ATORVASTATIN CALCIUM 20 MG PO TABS: 20.0000 mg | ORAL_TABLET | Freq: Every day | ORAL | Status: DC

## 2013-05-14 NOTE — Telephone Encounter (Signed)
Mailed unread message to pt  

## 2013-05-17 ENCOUNTER — Encounter: Payer: Self-pay | Admitting: Internal Medicine

## 2013-05-17 DIAGNOSIS — F439 Reaction to severe stress, unspecified: Secondary | ICD-10-CM | POA: Insufficient documentation

## 2013-05-17 NOTE — Assessment & Plan Note (Signed)
Unable to take bisphonates secondary to her renal insufficiency.  Had bone density 12/13 - osteopenia.  No significant change.           

## 2013-05-17 NOTE — Assessment & Plan Note (Signed)
Increased stress as outlined.  Discussed with her at length.  We discussed returning to counseling.  She does not feel she needs anything more at this time.  Follow.

## 2013-05-17 NOTE — Assessment & Plan Note (Signed)
Blood pressure under good control.  Will continue same meds.  Follow metabolic panel.        

## 2013-05-17 NOTE — Assessment & Plan Note (Signed)
Seeing Dr Lateef.  Renal function has been stable.  Avoid antiinflammatories and other nephrotoxic meds.  Last Cr 1.2.    

## 2013-05-17 NOTE — Assessment & Plan Note (Signed)
Low cholesterol diet and exercise.  On Lipitor.  Follow lipid panel and liver function.    

## 2013-05-17 NOTE — Assessment & Plan Note (Signed)
Has been following with Dr Hyacinth Meeker.  Sugars as outlined.  Increased.  A1c - 8.6 on last check.  She had missed doses of Lantus.  Not using the humalog.   Diet and exercise.  Keep up to date with eye exams. Check and record sugars and send in over the next few weeks.  Record readings at different times.  Follow met b and a1c.  Follow sugars closely.

## 2013-05-17 NOTE — Assessment & Plan Note (Signed)
Continue CPAP.  

## 2013-05-17 NOTE — Progress Notes (Signed)
Subjective:    Patient ID: Zoe Stewart, female    DOB: [birth year], 72 y.o.   MRN: [id number]  HPI 72 year old female with past history of hypertension, renal insufficiency, diabetes and hypercholesterolemia who comes in today to follow up on these issues as well as for a complete physical exam.   Under increased stress.  Her husband recently passed away.  She is trying to get her house ready to sell.  Planning to move close to her family in [place].  Feels she is handling things relatively well now.  Sleeping better.  Eating better.  Appetite has improved.  Sugars increased recently.  She is still skipping some of her medications.  She is now watching more what she eats.  Trying to stay active.  Sugars attached.  She is not using the humalog now.  Only on Lantus and Januvia.  She has cut down on her Lantus.  Now taking 20 units bid.  Adjust as needed.   Saw Dr Cherylann Ratel recently.  Kidney function stable.  No chest pain or tightness.   Breathing stable.  No signficant dizziness or light headedness.       Past Medical History  Diagnosis Date  . Osteoarthritis     trigger nodules, carpal tunnel, arthritis of the foot  . Diabetes mellitus   . Hypertension   . Renal insufficiency   . Hypercholesterolemia   . Peripheral neuropathy   . Osteoporosis   . GERD (gastroesophageal reflux disease)   . Muscle cramps     borderline low magnesium    Current Outpatient Prescriptions on File Prior to Visit  Medication Sig Dispense Refill  . amitriptyline (ELAVIL) 10 MG tablet Take 10 mg by mouth at bedtime.      Marland Kitchen amLODipine (NORVASC) 10 MG tablet Take 10 mg by mouth daily.      . Cholecalciferol ([employer]S VITAMIN D3) 1000 UNITS capsule Take 1,000 Units by mouth daily.      Marland Kitchen glucose blood test strip One touch ultra test strip Check blood sugar tid      . insulin glargine (LANTUS) 100 UNIT/ML injection Lantus Solostar Pen 100 Unit/Takes 40 units q am and 38 units q hs  30 mL  1  . lisinopril  (PRINIVIL,ZESTRIL) 40 MG tablet Take 1 tablet (40 mg total) by mouth daily.  90 tablet  1  . omeprazole (PRILOSEC) 40 MG capsule Take 40 mg by mouth 2 (two) times daily.      . sitaGLIPtin (JANUVIA) 100 MG tablet Take 100 mg by mouth daily.      . insulin lispro (HUMALOG) 100 UNIT/ML injection Inject into the skin 3 (three) times daily before meals. 12 units q am, 10 units at lunch and 18 units before supper       No current facility-administered medications on file prior to visit.    Review of Systems No light headedness or dizziness.   No headache.  No sinus or allergy symptoms.  No chest pain, tightness or palpitations.  No increased shortness of breath, cough or congestion.  No acid reflux, dysphagia or odynophagia.  No nausea or vomiting. States she is eating better.  Appetite improved.   No abdominal pain or cramping.  No bowel change, such as diarrhea, constipation, BRBPR or melana.  No urine change.   Sugars as outlined.  Still with increased stress.  Dealing with her husbands death.        Objective:   Physical Exam  Filed Vitals:  05/14/13 1326  BP: 120/80  Pulse: 70  Temp: 98 F (19.61 C)   72 year old female in no acute distress.   HEENT:  Nares- clear.  Oropharynx - without lesions. NECK:  Supple.  Nontender.  No audible bruit.  HEART:  Appears to be regular. LUNGS:  No crackles or wheezing audible.  Respirations even and unlabored.  RADIAL PULSE:  Equal bilaterally.    BREASTS:  No nipple discharge or nipple retraction present.  Could not appreciate any distinct nodules or axillary adenopathy.  ABDOMEN:  Soft, nontender.  Bowel sounds present and normal.  No audible abdominal bruit. Marland Kitchen   EXTREMITIES:  No increased edema present.  DP pulses palpable and equal bilaterally.      FEET:  No lesions.         Assessment & Plan:  DIZZINESS.  Resolved.  Saw ENT.  Had Epley maneuvers.  Helped.    FOOT FRACTURE.  Doing well.  Saw Dr Alberteen Spindle.   HEALTH MAINTENANCE.  Physical  today.  Last colonoscopy 11/19/08.  IFOB given.  Gave her the information to schedule her mammogram.

## 2013-05-20 ENCOUNTER — Other Ambulatory Visit (INDEPENDENT_AMBULATORY_CARE_PROVIDER_SITE_OTHER): Payer: Medicare Other

## 2013-05-20 DIAGNOSIS — Z1211 Encounter for screening for malignant neoplasm of colon: Secondary | ICD-10-CM

## 2013-05-20 LAB — FECAL OCCULT BLOOD, IMMUNOCHEMICAL: Fecal Occult Bld: NEGATIVE

## 2013-05-21 ENCOUNTER — Encounter: Payer: Self-pay | Admitting: Internal Medicine

## 2013-05-25 NOTE — Telephone Encounter (Signed)
Mailed unread message to pt  

## 2013-05-28 ENCOUNTER — Ambulatory Visit: Payer: Self-pay | Admitting: Internal Medicine

## 2013-06-02 ENCOUNTER — Other Ambulatory Visit: Payer: Medicare Other

## 2013-06-03 ENCOUNTER — Other Ambulatory Visit: Payer: Medicare Other

## 2013-06-04 ENCOUNTER — Ambulatory Visit: Payer: Medicare Other

## 2013-06-05 ENCOUNTER — Encounter: Payer: Self-pay | Admitting: Internal Medicine

## 2013-06-08 ENCOUNTER — Ambulatory Visit (INDEPENDENT_AMBULATORY_CARE_PROVIDER_SITE_OTHER): Payer: Medicare Other | Admitting: Adult Health

## 2013-06-08 ENCOUNTER — Telehealth: Payer: Self-pay | Admitting: Internal Medicine

## 2013-06-08 ENCOUNTER — Encounter: Payer: Self-pay | Admitting: Adult Health

## 2013-06-08 VITALS — BP 112/60 | HR 104 | Temp 98.2°F | Resp 12 | Wt 171.5 lb

## 2013-06-08 DIAGNOSIS — R21 Rash and other nonspecific skin eruption: Secondary | ICD-10-CM

## 2013-06-08 MED ORDER — DIPHENHYDRAMINE HCL 25 MG PO CAPS: 25.0000 mg | ORAL_CAPSULE | Freq: Four times a day (QID) | ORAL | Status: AC | PRN

## 2013-06-08 MED ORDER — TRIAMCINOLONE ACETONIDE 0.1 % EX OINT: TOPICAL_OINTMENT | Freq: Two times a day (BID) | CUTANEOUS | Status: AC

## 2013-06-08 NOTE — Telephone Encounter (Signed)
Pt dropped sugar readings  In box

## 2013-06-08 NOTE — Telephone Encounter (Signed)
In your folder 

## 2013-06-08 NOTE — Progress Notes (Signed)
  Subjective:    Patient ID: Zoe Stewart, female    DOB: 09-25-1940, 72 y.o.   MRN: [id number]  HPI  Patient is a pleasant 72 year old female who presents to clinic with several areas on her body that are itching and red. She has an area behind her neck, right arm, left arm, right cheek and left knee that are red and irritated. She has not taken any over-the-counter products. She reports that she has been outside in the yard raking leaves. Symptoms began shortly after.  Current Outpatient Prescriptions on File Prior to Visit  Medication Sig Dispense Refill  . amitriptyline (ELAVIL) 10 MG tablet Take 10 mg by mouth at bedtime.      Marland Kitchen amLODipine (NORVASC) 10 MG tablet Take 10 mg by mouth daily.      Marland Kitchen atorvastatin (LIPITOR) 20 MG tablet Take 1 tablet (20 mg total) by mouth daily.  30 tablet  4  . Cholecalciferol ([employer]S VITAMIN D3) 1000 UNITS capsule Take 1,000 Units by mouth daily.      Marland Kitchen glucose blood test strip One touch ultra test strip Check blood sugar tid      . insulin glargine (LANTUS) 100 UNIT/ML injection Lantus Solostar Pen 100 Unit/Takes 40 units q am and 38 units q hs  30 mL  1  . insulin lispro (HUMALOG) 100 UNIT/ML injection Inject into the skin 3 (three) times daily before meals. 12 units q am, 10 units at lunch and 18 units before supper      . lisinopril (PRINIVIL,ZESTRIL) 40 MG tablet Take 1 tablet (40 mg total) by mouth daily.  90 tablet  1  . omeprazole (PRILOSEC) 40 MG capsule Take 40 mg by mouth 2 (two) times daily.      . sitaGLIPtin (JANUVIA) 100 MG tablet Take 100 mg by mouth daily.       No current facility-administered medications on file prior to visit.     Review of Systems  Skin: Positive for rash.       Behind neck, right arm, left arm, left knee, right cheek       Objective:   Physical Exam  Skin:             Assessment & Plan:

## 2013-06-08 NOTE — Patient Instructions (Signed)
  Start applying triamcinolone to the affected areas twice daily.  Take benadryl 25 mg every 6 hours as needed for the itching.  Use a mild soap like ivory or dove soap.

## 2013-06-08 NOTE — Assessment & Plan Note (Signed)
Triamcinolone to affected areas twice a day. Benadryl 25 mg every 6 hours as needed for itching.

## 2013-06-09 NOTE — Telephone Encounter (Signed)
I reviewed her recent sugar readings and they appear to be overall improved.  Some am sugars are low (in the 70s).  It appears she is varying the dose of her insulin.  Need to confirm she is off the humalog and only taking lantus.  I can't tell how much Lantus she is taking.  It appears some days she is not taking any Lantus in the evening.  May need to adjust her insulin down some in the evening - but I can't tell how much she is taking or when she is taking her medication.  Please clarify this with her.  Also, need her to continue to record sugars and record insulin taking.  May need an earlier appt to discuss.  Make sure she is eating a snack before bed.

## 2013-06-10 NOTE — Telephone Encounter (Signed)
Noted.  Have her eat the snack and check and record sugars.  Send in readings within the next couple of weeks.

## 2013-06-10 NOTE — Telephone Encounter (Signed)
Pt states that she is only taking the Lantus & only taking it at bedtime (Usual doses are 20 or 25). She also states that she has not been eating a snack at bedtime & she hasn't been checking her sugars in the evening. She will conitnue to check & records her sugar readings & insulin dosages.

## 2013-06-15 ENCOUNTER — Encounter: Payer: Self-pay | Admitting: Internal Medicine

## 2013-06-15 ENCOUNTER — Telehealth: Payer: Self-pay | Admitting: *Deleted

## 2013-06-15 MED ORDER — GLUCOSE BLOOD VI STRP: ORAL_STRIP | Status: DC

## 2013-06-15 NOTE — Telephone Encounter (Signed)
Script refilled electronically

## 2013-06-15 NOTE — Telephone Encounter (Signed)
Refill Request  One Touch Ultra Strip

## 2013-06-22 ENCOUNTER — Other Ambulatory Visit: Payer: Self-pay | Admitting: *Deleted

## 2013-06-22 MED ORDER — GLUCOSE BLOOD VI STRP: ORAL_STRIP | Status: AC

## 2013-06-30 ENCOUNTER — Ambulatory Visit (INDEPENDENT_AMBULATORY_CARE_PROVIDER_SITE_OTHER): Payer: Medicare Other | Admitting: Internal Medicine

## 2013-06-30 ENCOUNTER — Encounter: Payer: Self-pay | Admitting: Internal Medicine

## 2013-06-30 VITALS — BP 120/60 | HR 75 | Temp 97.9°F | Ht 60.0 in | Wt 172.8 lb

## 2013-06-30 DIAGNOSIS — F439 Reaction to severe stress, unspecified: Secondary | ICD-10-CM

## 2013-06-30 DIAGNOSIS — E119 Type 2 diabetes mellitus without complications: Secondary | ICD-10-CM

## 2013-06-30 DIAGNOSIS — Z733 Stress, not elsewhere classified: Secondary | ICD-10-CM

## 2013-06-30 DIAGNOSIS — M810 Age-related osteoporosis without current pathological fracture: Secondary | ICD-10-CM

## 2013-06-30 DIAGNOSIS — G4730 Sleep apnea, unspecified: Secondary | ICD-10-CM

## 2013-06-30 DIAGNOSIS — E7800 Pure hypercholesterolemia, unspecified: Secondary | ICD-10-CM

## 2013-06-30 DIAGNOSIS — I10 Essential (primary) hypertension: Secondary | ICD-10-CM

## 2013-06-30 DIAGNOSIS — N289 Disorder of kidney and ureter, unspecified: Secondary | ICD-10-CM

## 2013-06-30 LAB — HM DIABETES FOOT EXAM

## 2013-06-30 MED ORDER — SERTRALINE HCL 50 MG PO TABS: 50.0000 mg | ORAL_TABLET | Freq: Every day | ORAL | Status: AC

## 2013-06-30 NOTE — Patient Instructions (Signed)
zoloft (sertraline) 50mg  - take 1/2 tablet x 1 week and then one per day.

## 2013-06-30 NOTE — Progress Notes (Signed)
Pre-visit discussion using our clinic review tool. No additional management support is needed unless otherwise documented below in the visit note.  

## 2013-07-04 ENCOUNTER — Encounter: Payer: Self-pay | Admitting: Internal Medicine

## 2013-07-04 NOTE — Assessment & Plan Note (Signed)
Increased stress as outlined.  Discussed with her at length.  We discussed returning to counseling.  She does not feel she needs this at this time.  No suicidal thoughts.  Does feel she needs something to help "level things off".  Will start zoloft 50mg  1/2 tablet x 1 week and then increase to one whole tablet per day.  Follow closely.  Get her back in soon to reassess.

## 2013-07-04 NOTE — Assessment & Plan Note (Signed)
Seeing Dr Cherylann Ratel.  Renal function has been stable.  Avoid antiinflammatories and other nephrotoxic meds.  Last Cr 1.2.

## 2013-07-04 NOTE — Assessment & Plan Note (Signed)
Blood pressure under good control.  Will continue same meds.  Follow metabolic panel.

## 2013-07-04 NOTE — Assessment & Plan Note (Signed)
Low cholesterol diet and exercise.  On Lipitor.  Follow lipid panel and liver function.

## 2013-07-04 NOTE — Assessment & Plan Note (Signed)
Continue CPAP.  

## 2013-07-04 NOTE — Assessment & Plan Note (Signed)
Unable to take bisphonates secondary to her renal insufficiency.  Had bone density 12/13 - osteopenia.  No significant change.

## 2013-07-04 NOTE — Assessment & Plan Note (Signed)
Had been following with Dr Hyacinth Meeker.  Sugars as outlined.  Increased.  A1c - 8.6 on last check.  Sugars better now.  On Lantus and Januvia.  Not using the humalog.   Diet and exercise.  Keep up to date with eye exams.  Discussed the proper way to take Lantus.   Should not be taken tid. Monitor for lows.   Follow met b and a1c.  Follow sugars closely.

## 2013-07-04 NOTE — Progress Notes (Signed)
Subjective:    Patient ID: Zoe Stewart, female    DOB: Oct 22, 1940, 72 y.o.   MRN: [id number]  HPI 72 year old female with past history of hypertension, renal insufficiency, diabetes and hypercholesterolemia who comes in today for a scheduled follow up.  Under increased stress.  Her husband recently passed away.  She is trying to get her house ready to sell.  Planning to move close to her family in [place].  She reports increased stress as outlined.  She is noticing some nausea and pressure when she awakens.  Pressure localized to the posterior head and neck region.  No headache.  She relates this to increased stress.  Sleeping better.  Eating better.  Appetite has improved.  Sugars have improved.  She is now watching more what she eats.  Staying active.  Now AM sugars averaging 90-110 and pm sugars averaging 120-180s.  Overall improved.   She is not using the humalog now.  Only on Lantus and Januvia.  After reviewing her log, it does appear that there are some days she is giving herself lantus tid.  We discussed this at length.  discussed the proper way to take her Lantus.  She has cut down on her Lantus.  See her record for details.  Sees Dr Cherylann Ratel.  Kidney function stable.  No chest pain or tightness.   Breathing stable.  Previously had issues with dizziness.  Saw ENT.  crystals "realigned".  Had an episode last week of dizziness.  This resolved.  Feels she needs something to help with the stress.      Past Medical History  Diagnosis Date  . Osteoarthritis     trigger nodules, carpal tunnel, arthritis of the foot  . Diabetes mellitus   . Hypertension   . Renal insufficiency   . Hypercholesterolemia   . Peripheral neuropathy   . Osteoporosis   . GERD (gastroesophageal reflux disease)   . Muscle cramps     borderline low magnesium    Current Outpatient Prescriptions on File Prior to Visit  Medication Sig Dispense Refill  . amitriptyline (ELAVIL) 10 MG tablet Take 10 mg by mouth at  bedtime.      Marland Kitchen amLODipine (NORVASC) 10 MG tablet Take 10 mg by mouth daily.      Marland Kitchen atorvastatin (LIPITOR) 20 MG tablet Take 1 tablet (20 mg total) by mouth daily.  30 tablet  4  . diphenhydrAMINE (BENADRYL) 25 mg capsule Take 1 capsule (25 mg total) by mouth every 6 (six) hours as needed for itching.  30 capsule  0  . glucose blood test strip One touch ultra test strip Check blood sugar three times a day  300 each  3  . insulin glargine (LANTUS) 100 UNIT/ML injection Lantus Solostar Pen 100 Unit/Takes 40 units q am and 38 units q hs  30 mL  1  . lisinopril (PRINIVIL,ZESTRIL) 40 MG tablet Take 1 tablet (40 mg total) by mouth daily.  90 tablet  1  . sitaGLIPtin (JANUVIA) 100 MG tablet Take 100 mg by mouth daily.      Marland Kitchen triamcinolone ointment (KENALOG) 0.1 % Apply topically 2 (two) times daily.  30 g  0  . Cholecalciferol (CVS VITAMIN D3) 1000 UNITS capsule Take 1,000 Units by mouth daily.      . insulin lispro (HUMALOG) 100 UNIT/ML injection Inject into the skin 3 (three) times daily before meals. 12 units q am, 10 units at lunch and 18 units before supper  No current facility-administered medications on file prior to visit.    Review of Systems No light headedness or dizziness now.   Went to ENT.  Epley maneuvers helped.  She does report the am nausea and pressure in her posterior head and neck.  No headache.  No sinus or allergy symptoms.  No chest pain, tightness or palpitations.  No increased shortness of breath, cough or congestion.  No acid reflux, dysphagia or odynophagia.  No nausea or vomiting. States she is eating better.  Appetite improved.   No abdominal pain or cramping.  No bowel change, such as diarrhea, constipation, BRBPR or melana.  No urine change.   Sugars as outlined.  Still with increased stress.  Dealing with her husbands death.  Was seeing a counselor at [facility].  Helped.  Feels she needs something to help her deal with the stress.        Objective:   Physical  Exam  Filed Vitals:   06/30/13 1107  BP: 120/60  Pulse: 75  Temp: 97.9 F (36.6 C)   Pulse recheck:  2  72 year old female in no acute distress.   HEENT:  Nares - clear.  OP- without lesions or erythema.   NECK:  Supple, nontender.  No audible carotid bruit.   HEART:  Appears to be regular. LUNGS:  Without crackles or wheezing audible.  Respirations even and unlabored.   RADIAL PULSE:  Equal bilaterally.  ABDOMEN:  Soft, nontender.  No audible abdominal bruit.   EXTREMITIES:  No increased edema present.  FEET:  Without lesions.                     Assessment & Plan:  DIZZINESS.  Resolved.  Saw ENT.  Had Epley maneuvers.  Helped.    FOOT FRACTURE.  Doing well.  Saw Dr Alberteen Spindle.   HEALTH MAINTENANCE.  Schedule for a physical next visit.  Discuss health maintenance issues with her at that time.

## 2013-07-15 ENCOUNTER — Encounter: Payer: Self-pay | Admitting: *Deleted

## 2013-08-18 ENCOUNTER — Other Ambulatory Visit: Payer: Self-pay | Admitting: *Deleted

## 2013-08-19 MED ORDER — INSULIN GLARGINE 100 UNIT/ML ~~LOC~~ SOLN: SUBCUTANEOUS | Status: DC

## 2013-08-19 MED ORDER — LISINOPRIL 40 MG PO TABS: 40.0000 mg | ORAL_TABLET | Freq: Every day | ORAL | Status: AC

## 2013-08-19 MED ORDER — SITAGLIPTIN PHOSPHATE 100 MG PO TABS: 100.0000 mg | ORAL_TABLET | Freq: Every day | ORAL | Status: AC

## 2013-09-08 ENCOUNTER — Telehealth: Payer: Self-pay | Admitting: Internal Medicine

## 2013-09-08 NOTE — Telephone Encounter (Signed)
Yes.  Labs are ordered.  Please schedule her for a fasting lab appt and contact her with a lab appt date and time.    Thanks.   Dr Lorin PicketScott

## 2013-09-08 NOTE — Telephone Encounter (Signed)
See below my chart message  Yes I would like to see Dr. Lorin PicketScott as soon as possible please. Also, could we get a lab appt. so we can see what my a1c says now?  I made her a follow up appointment for 3/10.  Do you want labs prior

## 2013-10-13 ENCOUNTER — Ambulatory Visit: Payer: Medicare Other | Admitting: Internal Medicine

## 2013-11-23 ENCOUNTER — Other Ambulatory Visit: Payer: Self-pay | Admitting: Internal Medicine

## 2013-11-30 ENCOUNTER — Other Ambulatory Visit: Payer: Self-pay | Admitting: *Deleted

## 2013-11-30 MED ORDER — ATORVASTATIN CALCIUM 20 MG PO TABS: 20.0000 mg | ORAL_TABLET | Freq: Every day | ORAL | Status: AC

## 2014-01-06 IMAGING — MG MM DIGITAL SCREENING BILAT W/ CAD
1 series · 4 of 4 positions shown · non-contrast
Comparison: Previous exam(s)

ACR Breast Density Category a: The breast tissue is almost entirely
fatty.

REASON FOR EXAM: SCR MAMMO NO ORDER
COMMENTS:

PROCEDURE:     MAM - MAM DGTL SCRN MAM NO ORDER W/CAD  - May 28, 2013 [DATE]
CLINICAL DATA: Screening.
EXAM:
DIGITAL SCREENING BILATERAL MAMMOGRAM WITH CAD

[R CC · right · 4 of 4 slices shown]
[im 1/4]
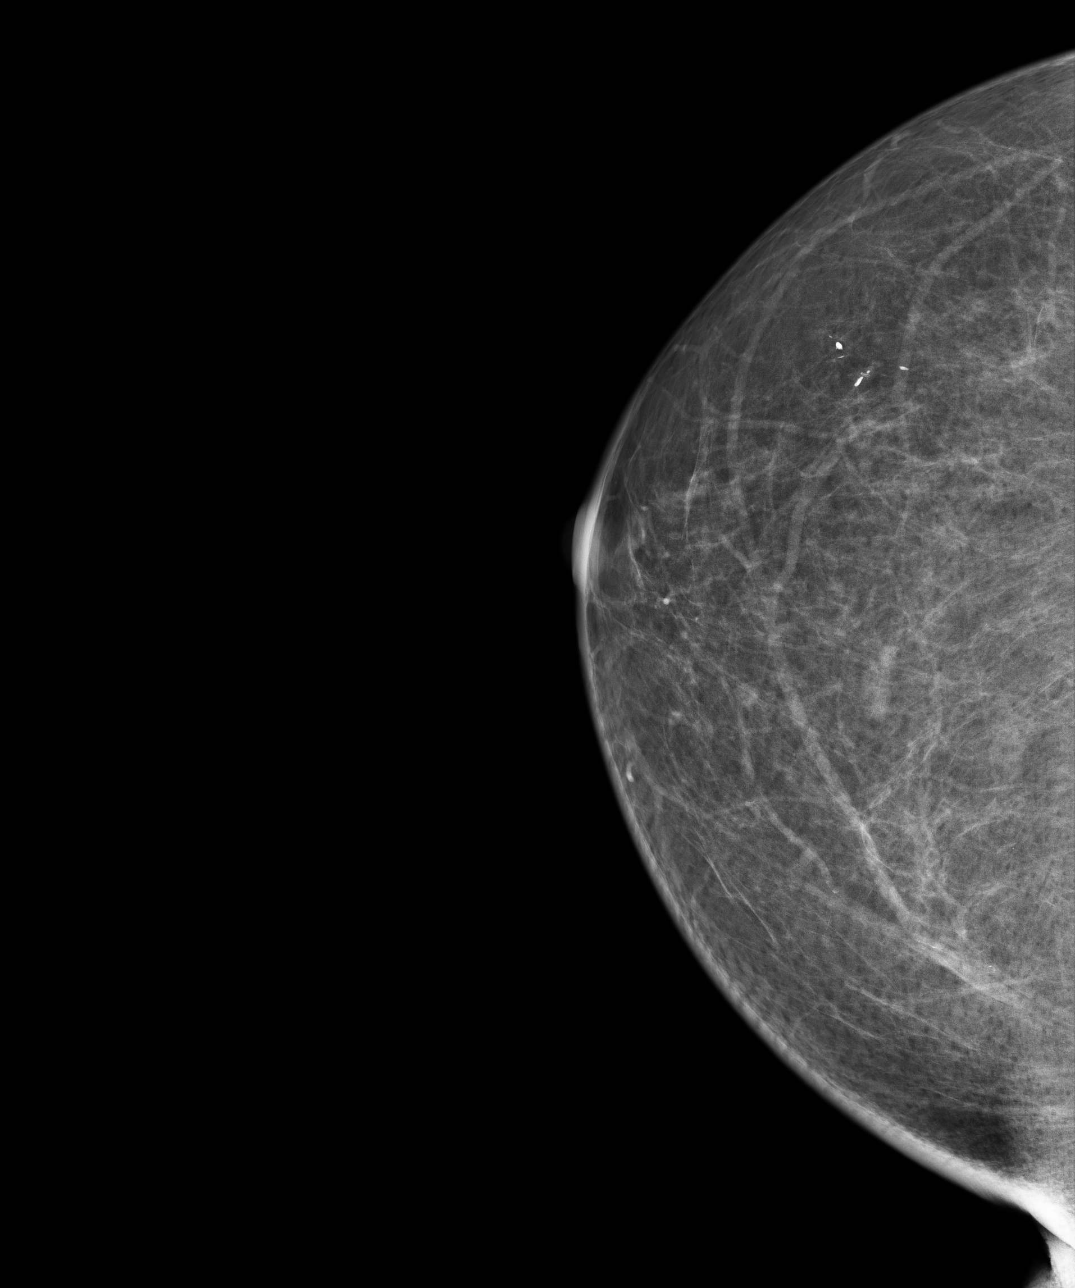
[im 2/4]
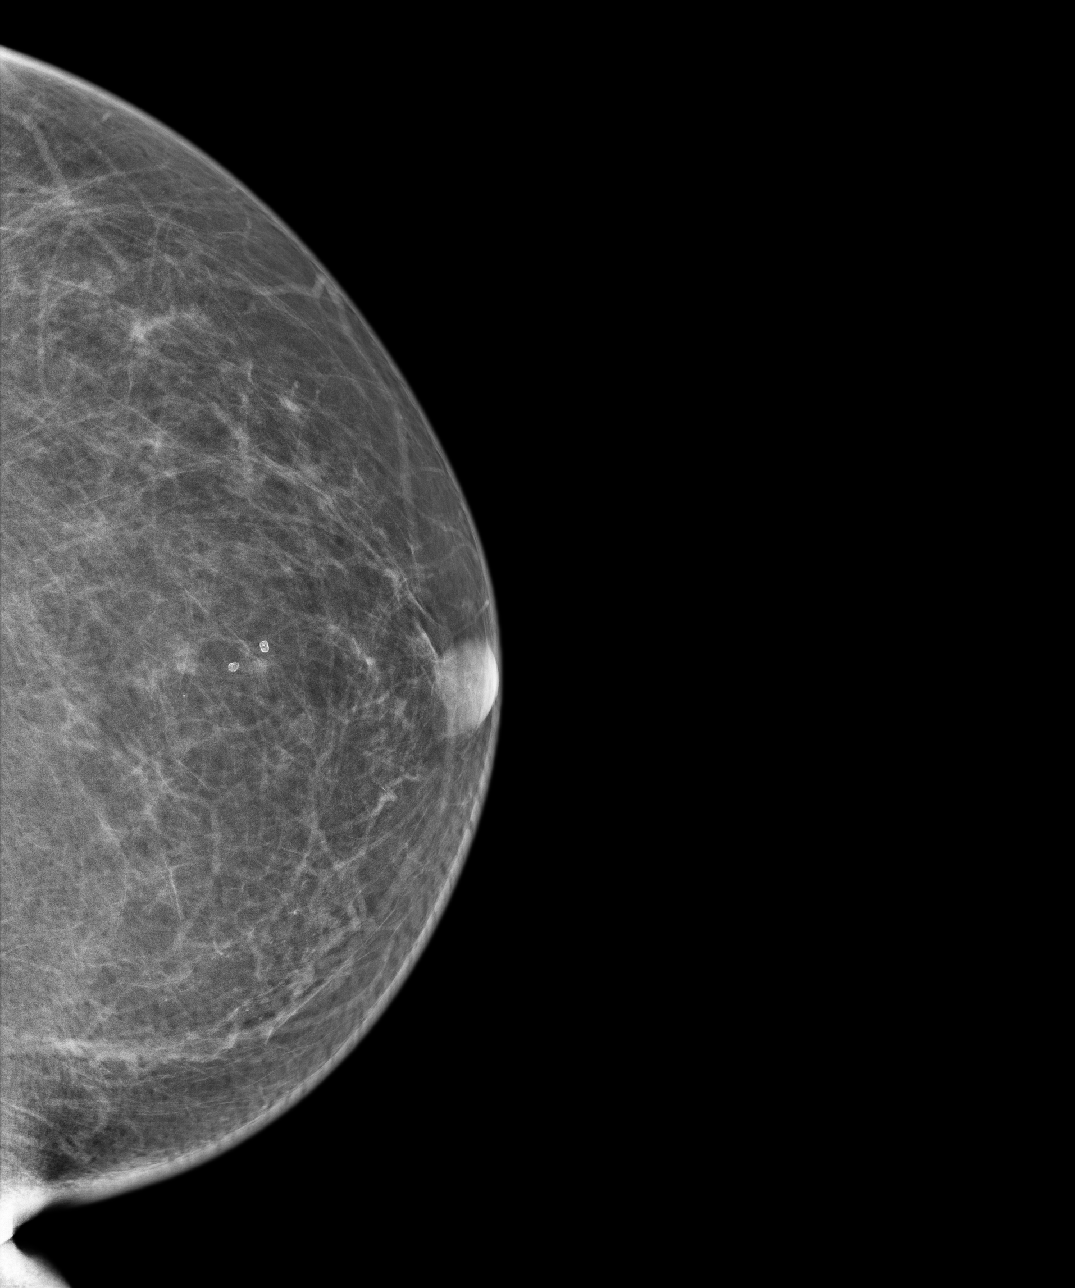
[im 3/4]
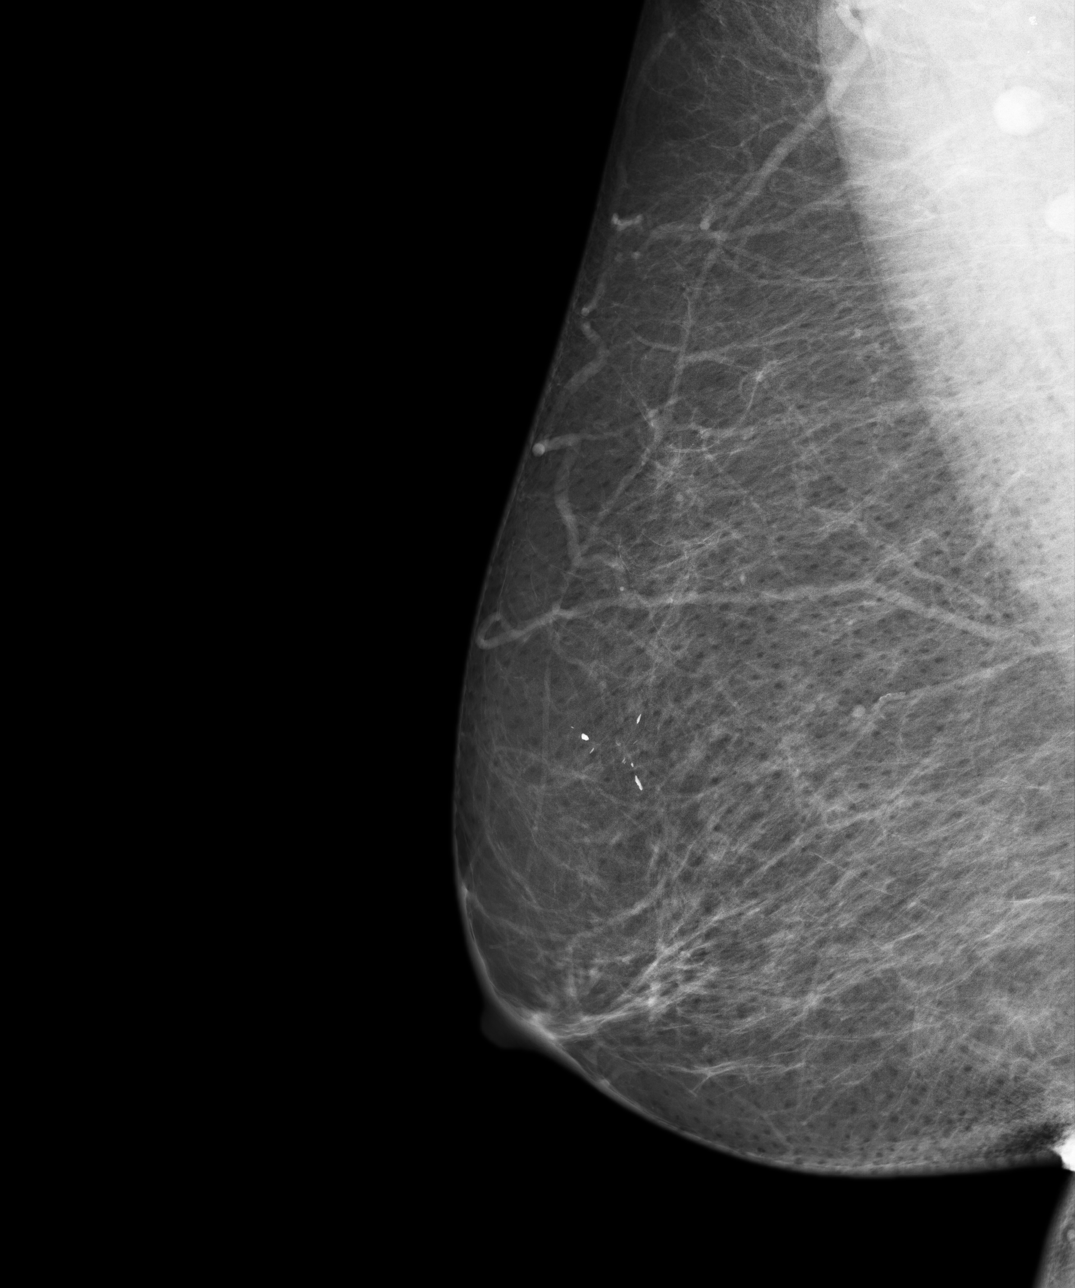
[im 4/4]
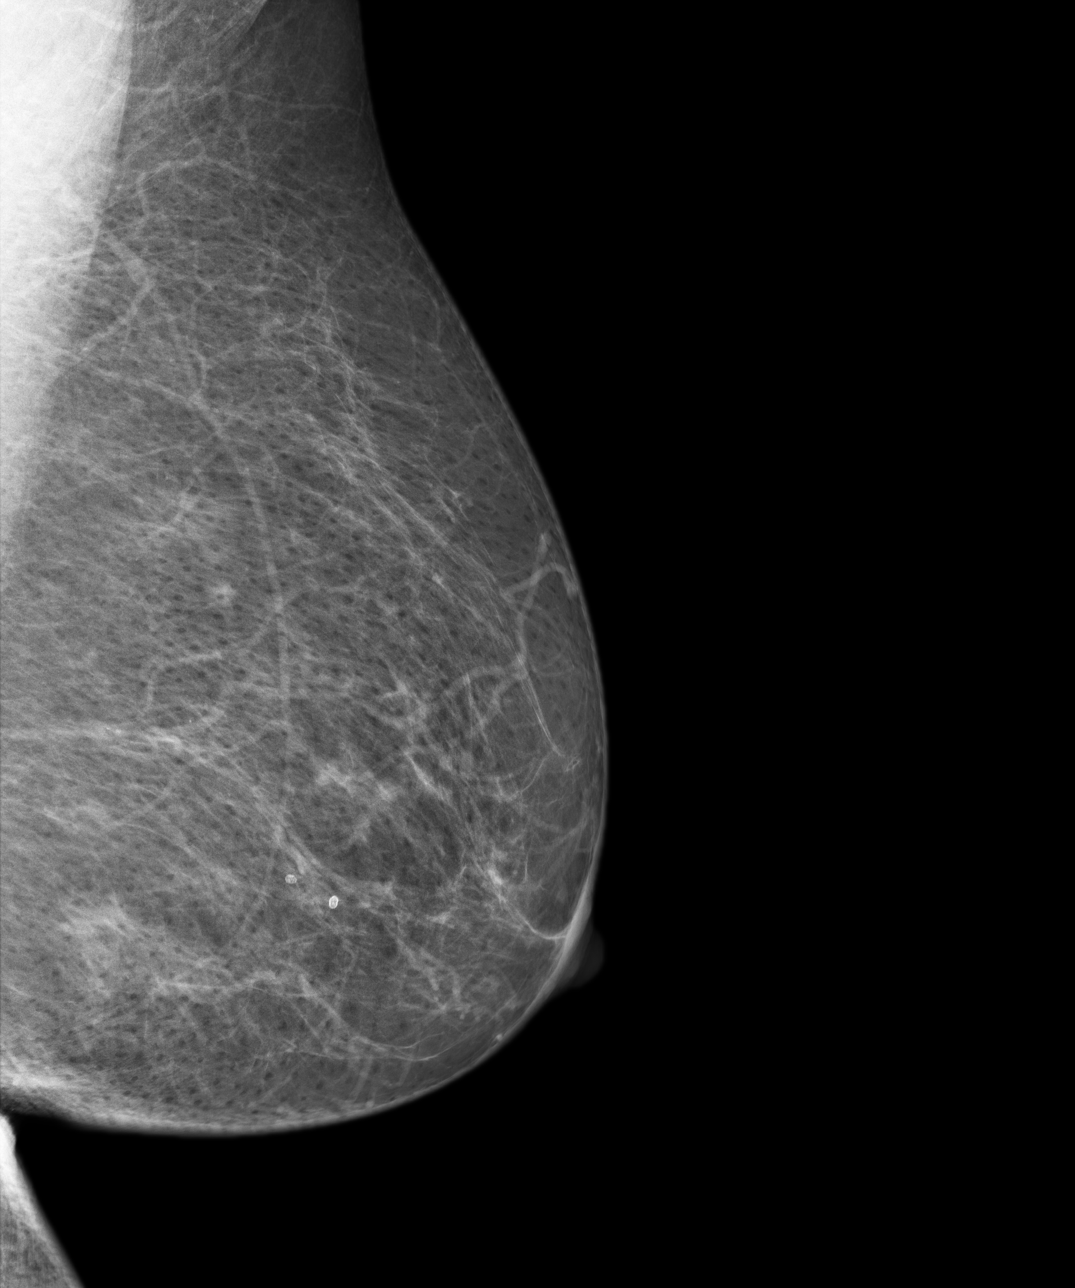

[4 of 4 positions shown; findings below may reference images not displayed]

FINDINGS: There are no findings suspicious for malignancy. Images were
processed with CAD.
IMPRESSION: No mammographic evidence of malignancy. A result letter of this
screening mammogram will be mailed directly to the patient.

RECOMMENDATION:
Screening mammogram in one year. (Code:[id number])

BI-RADS CATEGORY  1: Negative

## 2014-05-18 ENCOUNTER — Encounter: Payer: Medicare Other | Admitting: Internal Medicine

## 2014-12-29 ENCOUNTER — Other Ambulatory Visit: Payer: Self-pay | Admitting: Internal Medicine

## 2014-12-30 NOTE — Telephone Encounter (Signed)
Sent mychart msg on need for labs and appt!

## 2015-01-06 NOTE — Telephone Encounter (Signed)
Spoke to pt, [place] moved to [place] and has established with a new doctor.

## 2015-01-07 ENCOUNTER — Other Ambulatory Visit: Payer: Self-pay | Admitting: Internal Medicine
